# Patient Record
Sex: Female | Born: 1995 | Hispanic: No | Marital: Single | Smoking: Never smoker
Health system: Southern US, Community
[De-identification: ages and names within clinical notes are randomized; demographics above are authoritative.]

## PROBLEM LIST (undated history)

## (undated) DIAGNOSIS — Z789 Other specified health status: Secondary | ICD-10-CM

---
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
## 2016-05-26 ENCOUNTER — Encounter (HOSPITAL_COMMUNITY): Payer: Self-pay | Admitting: Emergency Medicine

## 2016-05-26 ENCOUNTER — Emergency Department (HOSPITAL_COMMUNITY)
Admission: EM | Admit: 2016-05-26 | Discharge: 2016-05-27 | Disposition: A | Discharge status: Home / Self Care | Payer: BLUE CROSS/BLUE SHIELD | Attending: Emergency Medicine | Admitting: Emergency Medicine

## 2016-05-26 ENCOUNTER — Emergency Department (HOSPITAL_COMMUNITY): Payer: BLUE CROSS/BLUE SHIELD

## 2016-05-26 DIAGNOSIS — Y92410 Unspecified street and highway as the place of occurrence of the external cause: Secondary | ICD-10-CM | POA: Insufficient documentation

## 2016-05-26 DIAGNOSIS — S01112A Laceration without foreign body of left eyelid and periocular area, initial encounter: Secondary | ICD-10-CM | POA: Insufficient documentation

## 2016-05-26 DIAGNOSIS — Y999 Unspecified external cause status: Secondary | ICD-10-CM | POA: Insufficient documentation

## 2016-05-26 DIAGNOSIS — S0181XA Laceration without foreign body of other part of head, initial encounter: Secondary | ICD-10-CM | POA: Diagnosis not present

## 2016-05-26 DIAGNOSIS — Y939 Activity, unspecified: Secondary | ICD-10-CM | POA: Diagnosis not present

## 2016-05-26 DIAGNOSIS — R51 Headache: Secondary | ICD-10-CM | POA: Diagnosis not present

## 2016-05-26 DIAGNOSIS — S01312A Laceration without foreign body of left ear, initial encounter: Secondary | ICD-10-CM | POA: Diagnosis not present

## 2016-05-26 LAB — CBC WITH DIFFERENTIAL/PLATELET
Basophils Absolute: 0 10*3/uL (ref 0.0–0.1)
Basophils Relative: 0 %
Eosinophils Absolute: 0.2 10*3/uL (ref 0.0–0.7)
Eosinophils Relative: 1 %
HCT: 44.3 % (ref 36.0–46.0)
Hemoglobin: 14.7 g/dL (ref 12.0–15.0)
Lymphocytes Relative: 18 %
Lymphs Abs: 2.9 10*3/uL (ref 0.7–4.0)
MCH: 30.5 pg (ref 26.0–34.0)
MCHC: 33.2 g/dL (ref 30.0–36.0)
MCV: 91.9 fL (ref 78.0–100.0)
Monocytes Absolute: 0.8 10*3/uL (ref 0.1–1.0)
Monocytes Relative: 5 %
Neutro Abs: 12.1 10*3/uL — ABNORMAL HIGH (ref 1.7–7.7)
Neutrophils Relative %: 76 %
Platelets: 250 10*3/uL (ref 150–400)
RBC: 4.82 MIL/uL (ref 3.87–5.11)
RDW: 12.9 % (ref 11.5–15.5)
WBC: 16 10*3/uL — ABNORMAL HIGH (ref 4.0–10.5)

## 2016-05-26 LAB — I-STAT BETA HCG BLOOD, ED (MC, WL, AP ONLY): I-stat hCG, quantitative: <5 m[IU]/mL (ref <5)

## 2016-05-26 LAB — BASIC METABOLIC PANEL
Anion gap: 12 (ref 5–15)
BUN: 9 mg/dL (ref 6–20)
CO2: 22 mmol/L (ref 22–32)
Calcium: 9.3 mg/dL (ref 8.9–10.3)
Chloride: 105 mmol/L (ref 101–111)
Creatinine, Ser: 0.76 mg/dL (ref 0.44–1.00)
GFR calc Af Amer: >60 mL/min (ref >60)
GFR calc non Af Amer: >60 mL/min (ref >60)
Glucose, Bld: 106 mg/dL — ABNORMAL HIGH (ref 65–99)
Potassium: 3.3 mmol/L — ABNORMAL LOW (ref 3.5–5.1)
Sodium: 139 mmol/L (ref 135–145)

## 2016-05-26 MED ORDER — LIDOCAINE-EPINEPHRINE (PF) 2 %-1:200000 IJ SOLN
20.0000 mL | Freq: Once | INTRAMUSCULAR | Status: AC
  Administered 2016-05-27: 20 mL
  Filled 2016-05-26: qty 20

## 2016-05-26 MED ORDER — LIDOCAINE-EPINEPHRINE (PF) 2 %-1:200000 IJ SOLN: 20.0000 mL | Freq: Once | INTRAMUSCULAR | Status: DC

## 2016-05-26 MED ORDER — TETANUS-DIPHTH-ACELL PERTUSSIS 5-2.5-18.5 LF-MCG/0.5 IM SUSP
0.5000 mL | Freq: Once | INTRAMUSCULAR | Status: AC
  Administered 2016-05-26: 0.5 mL via INTRAMUSCULAR
  Filled 2016-05-26: qty 0.5
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
## 2016-05-26 NOTE — ED Notes (Signed)
Pt raised head off the bed, stated she felt lightheaded. BP dropped and HR increased. Dr. Clydene PughKnott notified. 1L NS administered.
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
## 2016-05-27 DIAGNOSIS — S0181XA Laceration without foreign body of other part of head, initial encounter: Secondary | ICD-10-CM | POA: Diagnosis not present

## 2016-05-27 MED ORDER — MORPHINE SULFATE (PF) 4 MG/ML IV SOLN
4.0000 mg | Freq: Once | INTRAVENOUS | Status: AC
  Administered 2016-05-27: 4 mg via INTRAVENOUS
  Filled 2016-05-27: qty 1

## 2016-05-27 MED ORDER — IBUPROFEN 600 MG PO TABS: 600.0000 mg | ORAL_TABLET | Freq: Four times a day (QID) | ORAL | Status: DC | PRN

## 2016-05-27 MED ORDER — ONDANSETRON HCL 4 MG/2ML IJ SOLN
4.0000 mg | Freq: Once | INTRAMUSCULAR | Status: AC
  Administered 2016-05-27: 4 mg via INTRAVENOUS
  Filled 2016-05-27: qty 2

## 2016-05-27 MED ORDER — HYDROCODONE-ACETAMINOPHEN 5-325 MG PO TABS: 1.0000 | ORAL_TABLET | ORAL | Status: DC | PRN

## 2016-05-27 MED ORDER — LIDOCAINE HCL (PF) 1 % IJ SOLN
INTRAMUSCULAR | Status: AC
  Filled 2016-05-27: qty 30
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
## 2016-11-15 ENCOUNTER — Encounter (HOSPITAL_COMMUNITY): Payer: Self-pay | Admitting: Emergency Medicine

## 2016-11-15 ENCOUNTER — Emergency Department (HOSPITAL_COMMUNITY)
Admission: EM | Admit: 2016-11-15 | Discharge: 2016-11-15 | Disposition: A | Discharge status: Other Acute Inpatient Hospital | Payer: No Typology Code available for payment source | Attending: Emergency Medicine | Admitting: Emergency Medicine

## 2016-11-15 ENCOUNTER — Emergency Department (HOSPITAL_COMMUNITY): Payer: No Typology Code available for payment source

## 2016-11-15 DIAGNOSIS — S51801A Unspecified open wound of right forearm, initial encounter: Secondary | ICD-10-CM | POA: Diagnosis present

## 2016-11-15 DIAGNOSIS — Y929 Unspecified place or not applicable: Secondary | ICD-10-CM | POA: Insufficient documentation

## 2016-11-15 DIAGNOSIS — Y999 Unspecified external cause status: Secondary | ICD-10-CM | POA: Insufficient documentation

## 2016-11-15 DIAGNOSIS — S52101B Unspecified fracture of upper end of right radius, initial encounter for open fracture type I or II: Secondary | ICD-10-CM | POA: Diagnosis not present

## 2016-11-15 DIAGNOSIS — W3400XA Accidental discharge from unspecified firearms or gun, initial encounter: Secondary | ICD-10-CM | POA: Insufficient documentation

## 2016-11-15 DIAGNOSIS — S42401B Unspecified fracture of lower end of right humerus, initial encounter for open fracture: Secondary | ICD-10-CM

## 2016-11-15 DIAGNOSIS — Y939 Activity, unspecified: Secondary | ICD-10-CM | POA: Diagnosis not present

## 2016-11-15 DIAGNOSIS — S52001B Unspecified fracture of upper end of right ulna, initial encounter for open fracture type I or II: Secondary | ICD-10-CM | POA: Diagnosis not present

## 2016-11-15 HISTORY — PX: ARM WOUND REPAIR / CLOSURE: SUR1141

## 2016-11-15 HISTORY — PX: OTHER SURGICAL HISTORY: SHX169

## 2016-11-15 LAB — BASIC METABOLIC PANEL
Anion gap: 9 (ref 5–15)
BUN: 9 mg/dL (ref 6–20)
CO2: 22 mmol/L (ref 22–32)
Calcium: 9.3 mg/dL (ref 8.9–10.3)
Chloride: 106 mmol/L (ref 101–111)
Creatinine, Ser: 0.69 mg/dL (ref 0.44–1.00)
GFR calc Af Amer: >60 mL/min (ref >60)
GFR calc non Af Amer: >60 mL/min (ref >60)
Glucose, Bld: 129 mg/dL — ABNORMAL HIGH (ref 65–99)
Potassium: 3.5 mmol/L (ref 3.5–5.1)
Sodium: 137 mmol/L (ref 135–145)

## 2016-11-15 LAB — HCG, QUANTITATIVE, PREGNANCY: hCG, Beta Chain, Quant, S: 15 m[IU]/mL — ABNORMAL HIGH (ref <5)

## 2016-11-15 LAB — I-STAT BETA HCG BLOOD, ED (MC, WL, AP ONLY): I-stat hCG, quantitative: 13.7 m[IU]/mL — ABNORMAL HIGH (ref <5)

## 2016-11-15 LAB — CBC
HCT: 42.4 % (ref 36.0–46.0)
Hemoglobin: 13.9 g/dL (ref 12.0–15.0)
MCH: 28.7 pg (ref 26.0–34.0)
MCHC: 32.8 g/dL (ref 30.0–36.0)
MCV: 87.4 fL (ref 78.0–100.0)
Platelets: 252 10*3/uL (ref 150–400)
RBC: 4.85 MIL/uL (ref 3.87–5.11)
RDW: 15.3 % (ref 11.5–15.5)
WBC: 14 10*3/uL — ABNORMAL HIGH (ref 4.0–10.5)

## 2016-11-15 LAB — CDS SEROLOGY

## 2016-11-15 MED ORDER — HYDROMORPHONE HCL 2 MG/ML IJ SOLN
INTRAMUSCULAR | Status: AC
  Administered 2016-11-15: 1 mg
  Filled 2016-11-15: qty 1

## 2016-11-15 MED ORDER — CEFAZOLIN IN D5W 1 GM/50ML IV SOLN: 1.0000 g | Freq: Once | INTRAVENOUS | Status: DC

## 2016-11-15 MED ORDER — CEFAZOLIN SODIUM-DEXTROSE 2-4 GM/100ML-% IV SOLN
2.0000 g | Freq: Once | INTRAVENOUS | Status: AC
  Administered 2016-11-15: 2 g via INTRAVENOUS

## 2016-11-15 MED ORDER — ONDANSETRON HCL 4 MG/2ML IJ SOLN
4.0000 mg | Freq: Once | INTRAMUSCULAR | Status: AC
  Administered 2016-11-15: 4 mg via INTRAVENOUS

## 2016-11-15 MED ORDER — CEFAZOLIN SODIUM-DEXTROSE 2-4 GM/100ML-% IV SOLN
INTRAVENOUS | Status: AC
  Filled 2016-11-15: qty 100

## 2016-11-15 MED ORDER — FENTANYL CITRATE (PF) 100 MCG/2ML IJ SOLN
100.0000 ug | Freq: Once | INTRAMUSCULAR | Status: AC
  Administered 2016-11-15: 100 ug via INTRAVENOUS

## 2016-11-15 MED ORDER — HYDROMORPHONE HCL 2 MG/ML IJ SOLN
INTRAMUSCULAR | Status: AC
  Administered 2016-11-15: 2 mg
  Filled 2016-11-15: qty 1
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
## 2016-11-15 NOTE — ED Notes (Signed)
Lab to add on quantitative Hcg

## 2016-11-15 NOTE — ED Notes (Signed)
Pt reports last tetanus injection was given June of 2017.
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
## 2016-11-15 NOTE — ED Notes (Signed)
[facility]. Vitals stable. [facility] with [facility].
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
## 2016-12-15 ENCOUNTER — Inpatient Hospital Stay (HOSPITAL_COMMUNITY)
Admission: AD | Admit: 2016-12-15 | Discharge: 2016-12-16 | Disposition: A | Discharge status: Home / Self Care | Payer: Self-pay | Source: Ambulatory Visit | Attending: Obstetrics and Gynecology | Admitting: Obstetrics and Gynecology

## 2016-12-15 ENCOUNTER — Encounter (HOSPITAL_COMMUNITY): Payer: Self-pay

## 2016-12-15 ENCOUNTER — Inpatient Hospital Stay (HOSPITAL_COMMUNITY): Payer: Self-pay

## 2016-12-15 DIAGNOSIS — Z3A00 Weeks of gestation of pregnancy not specified: Secondary | ICD-10-CM | POA: Insufficient documentation

## 2016-12-15 DIAGNOSIS — Z79899 Other long term (current) drug therapy: Secondary | ICD-10-CM | POA: Insufficient documentation

## 2016-12-15 DIAGNOSIS — B9689 Other specified bacterial agents as the cause of diseases classified elsewhere: Secondary | ICD-10-CM | POA: Insufficient documentation

## 2016-12-15 DIAGNOSIS — O3680X0 Pregnancy with inconclusive fetal viability, not applicable or unspecified: Secondary | ICD-10-CM

## 2016-12-15 DIAGNOSIS — N760 Acute vaginitis: Secondary | ICD-10-CM | POA: Insufficient documentation

## 2016-12-15 DIAGNOSIS — O209 Hemorrhage in early pregnancy, unspecified: Secondary | ICD-10-CM | POA: Insufficient documentation

## 2016-12-15 HISTORY — DX: Other specified health status: Z78.9

## 2016-12-15 LAB — WET PREP, GENITAL
Sperm: NONE SEEN
TRICH WET PREP: NONE SEEN
YEAST WET PREP: NONE SEEN

## 2016-12-15 LAB — POCT PREGNANCY, URINE: PREG TEST UR: POSITIVE — AB

## 2016-12-15 LAB — HCG, QUANTITATIVE, PREGNANCY: hCG, Beta Chain, Quant, S: 2638 m[IU]/mL — ABNORMAL HIGH (ref <5)
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
## 2016-12-15 NOTE — MAU Note (Addendum)
Pt had 3 positive pregnancy tests 2 days ago. Had some spotting last week. Had some today as well.  LMP 11/27/2016 only 4 days. Last intercourse was 12/10/2016. Before that November 14, 2016.  Pt is worried because she had surgery on 11/15/16 and had x-ray on Tuesday.
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
## 2016-12-16 ENCOUNTER — Encounter (HOSPITAL_COMMUNITY): Payer: Self-pay | Admitting: *Deleted

## 2016-12-16 DIAGNOSIS — N760 Acute vaginitis: Secondary | ICD-10-CM

## 2016-12-16 DIAGNOSIS — O26851 Spotting complicating pregnancy, first trimester: Secondary | ICD-10-CM

## 2016-12-16 DIAGNOSIS — O23591 Infection of other part of genital tract in pregnancy, first trimester: Secondary | ICD-10-CM

## 2016-12-16 DIAGNOSIS — B9689 Other specified bacterial agents as the cause of diseases classified elsewhere: Secondary | ICD-10-CM

## 2016-12-16 DIAGNOSIS — Z3A01 Less than 8 weeks gestation of pregnancy: Secondary | ICD-10-CM

## 2016-12-16 LAB — CBC
HEMATOCRIT: 32.9 % — AB (ref 36.0–46.0)
Hemoglobin: 11 g/dL — ABNORMAL LOW (ref 12.0–15.0)
MCH: 28.7 pg (ref 26.0–34.0)
MCHC: 33.4 g/dL (ref 30.0–36.0)
MCV: 85.9 fL (ref 78.0–100.0)
Platelets: 229 10*3/uL (ref 150–400)
RBC: 3.83 MIL/uL — ABNORMAL LOW (ref 3.87–5.11)
RDW: 13.4 % (ref 11.5–15.5)
WBC: 7.7 10*3/uL (ref 4.0–10.5)

## 2016-12-16 LAB — ABO/RH: ABO/RH(D): A POS

## 2016-12-16 MED ORDER — METRONIDAZOLE 500 MG PO TABS: 500.0000 mg | ORAL_TABLET | Freq: Two times a day (BID) | ORAL | 0 refills | Status: DC

## 2016-12-16 NOTE — Progress Notes (Signed)
Jennifer Rasch NP in earlier to discuss test results and d/c plan. Written and verbal d/c instructions given and understanding voiced. 

## 2016-12-17 ENCOUNTER — Inpatient Hospital Stay (HOSPITAL_COMMUNITY)
Admission: AD | Admit: 2016-12-17 | Discharge: 2016-12-17 | Disposition: A | Discharge status: Home / Self Care | Payer: Self-pay | Source: Ambulatory Visit | Attending: Obstetrics and Gynecology | Admitting: Obstetrics and Gynecology

## 2016-12-17 DIAGNOSIS — Z79899 Other long term (current) drug therapy: Secondary | ICD-10-CM | POA: Insufficient documentation

## 2016-12-17 DIAGNOSIS — O209 Hemorrhage in early pregnancy, unspecified: Secondary | ICD-10-CM | POA: Insufficient documentation

## 2016-12-17 DIAGNOSIS — Z3A00 Weeks of gestation of pregnancy not specified: Secondary | ICD-10-CM | POA: Insufficient documentation

## 2016-12-17 LAB — GC/CHLAMYDIA PROBE AMP (~~LOC~~) NOT AT ARMC
Chlamydia: POSITIVE — AB
NEISSERIA GONORRHEA: POSITIVE — AB

## 2016-12-17 LAB — HCG, QUANTITATIVE, PREGNANCY: HCG, BETA CHAIN, QUANT, S: 2586 m[IU]/mL — AB (ref <5)

## 2016-12-17 NOTE — MAU Note (Signed)
PT  SAYS SHE WAS HERE 1-6-   FOR CRAMPING  AND  SPOTTING.     NOW   NO SPOTTING  OR  CRAMPING.
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
## 2016-12-18 ENCOUNTER — Telehealth (HOSPITAL_COMMUNITY): Payer: Self-pay | Admitting: *Deleted

## 2016-12-18 NOTE — Telephone Encounter (Signed)
Notified pt. Of positive gonorrhea and Chlamydia results.  Pt. Directed to go to local health dept., but declined stating she will return to OB/GYN office since she was seen there earlier today. Form sent to health dept. And pt. Advised to refrain from intercourse for 2 weeks following IV treatment.

## 2016-12-20 ENCOUNTER — Inpatient Hospital Stay (HOSPITAL_COMMUNITY)
Admission: AD | Admit: 2016-12-20 | Discharge: 2016-12-20 | Disposition: A | Discharge status: Home / Self Care | Payer: BLUE CROSS/BLUE SHIELD | Source: Ambulatory Visit | Attending: Obstetrics and Gynecology | Admitting: Obstetrics and Gynecology

## 2016-12-20 DIAGNOSIS — O039 Complete or unspecified spontaneous abortion without complication: Secondary | ICD-10-CM | POA: Insufficient documentation

## 2016-12-20 LAB — CBC WITH DIFFERENTIAL/PLATELET
Basophils Absolute: 0 10*3/uL (ref 0.0–0.1)
Basophils Relative: 0 %
EOS PCT: 1 %
Eosinophils Absolute: 0.1 10*3/uL (ref 0.0–0.7)
HEMATOCRIT: 36.8 % (ref 36.0–46.0)
Hemoglobin: 12.3 g/dL (ref 12.0–15.0)
LYMPHS ABS: 2.9 10*3/uL (ref 0.7–4.0)
LYMPHS PCT: 30 %
MCH: 28.5 pg (ref 26.0–34.0)
MCHC: 33.4 g/dL (ref 30.0–36.0)
MCV: 85.4 fL (ref 78.0–100.0)
MONO ABS: 0.7 10*3/uL (ref 0.1–1.0)
Monocytes Relative: 8 %
NEUTROS ABS: 5.9 10*3/uL (ref 1.7–7.7)
Neutrophils Relative %: 61 %
PLATELETS: 252 10*3/uL (ref 150–400)
RBC: 4.31 MIL/uL (ref 3.87–5.11)
RDW: 13.2 % (ref 11.5–15.5)
WBC: 9.7 10*3/uL (ref 4.0–10.5)

## 2016-12-20 LAB — BUN: BUN: 13 mg/dL (ref 6–20)

## 2016-12-20 LAB — CREATININE, SERUM
Creatinine, Ser: 0.58 mg/dL (ref 0.44–1.00)
GFR calc Af Amer: >60 mL/min (ref >60)
GFR calc non Af Amer: >60 mL/min (ref >60)

## 2016-12-20 LAB — TYPE AND SCREEN
ABO/RH(D): A POS
ANTIBODY SCREEN: NEGATIVE

## 2016-12-20 LAB — AST: AST: 17 U/L (ref 15–41)

## 2016-12-20 MED ORDER — METHOTREXATE INJECTION FOR WOMEN'S HOSPITAL
50.0000 mg/m2 | Freq: Once | INTRAMUSCULAR | Status: AC
  Administered 2016-12-20: 80 mg via INTRAMUSCULAR
  Filled 2016-12-20: qty 1.6

## 2016-12-20 MED ORDER — RHO D IMMUNE GLOBULIN 1500 UNIT/2ML IJ SOSY
300.0000 ug | PREFILLED_SYRINGE | Freq: Once | INTRAMUSCULAR | Status: DC
  Filled 2016-12-20: qty 2

## 2016-12-20 NOTE — Discharge Instructions (Signed)
Dr Marcelle OverlieHolland wants you to return to the office on Monday, Jan 13 and Thurs, Jan 16 for repeat hormone level checks.  These follow up visits are very important.  If you have any problems, call the office or if sever pain, return to Maternity Admissions.  Methotrexate Treatment for an Ectopic Pregnancy, Care After Refer to this sheet in the next few weeks. These instructions provide you with information on caring for yourself after your procedure. Your health care provider may also give you more specific instructions. Your treatment has been planned according to current medical practices, but problems sometimes occur. Call your health care provider if you have any problems or questions after your procedure. WHAT TO EXPECT AFTER THE PROCEDURE You may have some abdominal cramping, vaginal bleeding, and fatigue in the first few days after taking methotrexate. Some other possible side effects of methotrexate include:  Nausea.  Vomiting.  Diarrhea.  Mouth sores.  Swelling or irritation of the lining of your lungs (pneumonitis).  Liver damage.  Hair loss. HOME CARE INSTRUCTIONS  After you have received the methotrexate medicine, you need to be careful of your activities and watch your condition for several weeks. It may take 1 week before your hormone levels return to normal.  Keep all follow-up appointments as directed by your health care provider.  Avoid traveling too far away from your health care provider.  Do not have sexual intercourse until your health care provider says it is safe to do so.  You may resume your usual diet.  Limit strenuous activity.  Do not take folic acid, prenatal vitamins, or other vitamins that contain folic acid.  Do not take aspirin, ibuprofen, or naproxen (nonsteroidal anti-inflammatory drugs [NSAIDs]).  Do not drink alcohol. SEEK MEDICAL CARE IF:   You cannot control your nausea and vomiting.  You cannot control your diarrhea.  You have sores in your  mouth and want treatment.  You need pain medicine for your abdominal pain.  You have a rash.  You are having a reaction to the medicine. SEEK IMMEDIATE MEDICAL CARE IF:   You have increasing abdominal or pelvic pain.  You notice increased bleeding.  You feel light-headed, or you faint.  You have shortness of breath.  Your heart rate increases.  You have a cough.  You have chills.  You have a fever. This information is not intended to replace advice given to you by your health care provider. Make sure you discuss any questions you have with your health care provider. Document Released: 11/15/2011 Document Revised: 12/01/2013 Document Reviewed: 09/14/2013 Elsevier Interactive Patient Education  2017 [employer]

## 2016-12-20 NOTE — MAU Note (Signed)
Pt sent from MD office for MTX injection.  Pt having lower abd pain, also bleeding.

## 2016-12-20 NOTE — MAU Note (Signed)
MTX information sheet given to patient, educated pt about process in MAU to receive MTX.  Pt states she is not sure that she will be able to wait, informed patient of seriousness of ruptured ectopic.  Pt agrees that she will stay.

## 2017-11-26 ENCOUNTER — Encounter (HOSPITAL_COMMUNITY): Payer: Self-pay

## 2018-05-19 ENCOUNTER — Other Ambulatory Visit: Payer: Self-pay

## 2018-05-19 ENCOUNTER — Inpatient Hospital Stay (HOSPITAL_COMMUNITY): Payer: Medicaid Other | Admitting: Anesthesiology

## 2018-05-19 ENCOUNTER — Encounter (HOSPITAL_COMMUNITY)
Admission: AD | Disposition: A | Discharge status: Home / Self Care | Payer: Self-pay | Source: Home / Self Care | Attending: Obstetrics

## 2018-05-19 ENCOUNTER — Encounter (HOSPITAL_COMMUNITY): Payer: Self-pay

## 2018-05-19 ENCOUNTER — Inpatient Hospital Stay (HOSPITAL_COMMUNITY): Payer: Medicaid Other

## 2018-05-19 ENCOUNTER — Inpatient Hospital Stay (HOSPITAL_COMMUNITY)
Admission: AD | Admit: 2018-05-19 | Discharge: 2018-05-22 | DRG: 788 | Disposition: A | Discharge status: Home / Self Care | Payer: Medicaid Other | Attending: Obstetrics | Admitting: Obstetrics

## 2018-05-19 DIAGNOSIS — O99824 Streptococcus B carrier state complicating childbirth: Secondary | ICD-10-CM | POA: Diagnosis present

## 2018-05-19 DIAGNOSIS — Z3483 Encounter for supervision of other normal pregnancy, third trimester: Secondary | ICD-10-CM | POA: Diagnosis present

## 2018-05-19 DIAGNOSIS — Z3A38 38 weeks gestation of pregnancy: Secondary | ICD-10-CM

## 2018-05-19 DIAGNOSIS — O324XX0 Maternal care for high head at term, not applicable or unspecified: Secondary | ICD-10-CM | POA: Diagnosis present

## 2018-05-19 LAB — COMPREHENSIVE METABOLIC PANEL
ALBUMIN: 2.7 g/dL — AB (ref 3.5–5.0)
ALT: 13 U/L — AB (ref 14–54)
AST: 24 U/L (ref 15–41)
Alkaline Phosphatase: 201 U/L — ABNORMAL HIGH (ref 38–126)
Anion gap: 9 (ref 5–15)
BILIRUBIN TOTAL: 0.5 mg/dL (ref 0.3–1.2)
BUN: 9 mg/dL (ref 6–20)
CHLORIDE: 107 mmol/L (ref 101–111)
CO2: 19 mmol/L — ABNORMAL LOW (ref 22–32)
CREATININE: 0.62 mg/dL (ref 0.44–1.00)
Calcium: 8.7 mg/dL — ABNORMAL LOW (ref 8.9–10.3)
GFR calc Af Amer: >60 mL/min (ref >60)
GFR calc non Af Amer: >60 mL/min (ref >60)
GLUCOSE: 79 mg/dL (ref 65–99)
POTASSIUM: 4 mmol/L (ref 3.5–5.1)
Sodium: 135 mmol/L (ref 135–145)
Total Protein: 6 g/dL — ABNORMAL LOW (ref 6.5–8.1)

## 2018-05-19 LAB — URINALYSIS, ROUTINE W REFLEX MICROSCOPIC
BILIRUBIN URINE: NEGATIVE
Glucose, UA: NEGATIVE mg/dL
Hgb urine dipstick: NEGATIVE
Ketones, ur: NEGATIVE mg/dL
LEUKOCYTES UA: NEGATIVE
NITRITE: NEGATIVE
PH: 6 (ref 5.0–8.0)
Protein, ur: NEGATIVE mg/dL
SPECIFIC GRAVITY, URINE: 1.005 (ref 1.005–1.030)

## 2018-05-19 LAB — PROTEIN / CREATININE RATIO, URINE
Creatinine, Urine: 31 mg/dL
Protein Creatinine Ratio: 0.32 mg/mg{Cre} — ABNORMAL HIGH (ref 0.00–0.15)
Total Protein, Urine: 10 mg/dL

## 2018-05-19 LAB — CBC
HEMATOCRIT: 35.7 % — AB (ref 36.0–46.0)
Hemoglobin: 12.1 g/dL (ref 12.0–15.0)
MCH: 29.3 pg (ref 26.0–34.0)
MCHC: 33.9 g/dL (ref 30.0–36.0)
MCV: 86.4 fL (ref 78.0–100.0)
PLATELETS: 146 10*3/uL — AB (ref 150–400)
RBC: 4.13 MIL/uL (ref 3.87–5.11)
RDW: 14.2 % (ref 11.5–15.5)
WBC: 8.6 10*3/uL (ref 4.0–10.5)

## 2018-05-19 LAB — POCT FERN TEST: POCT FERN TEST: POSITIVE

## 2018-05-19 LAB — RPR: RPR Ser Ql: NONREACTIVE

## 2018-05-19 LAB — TYPE AND SCREEN
ABO/RH(D): A POS
ANTIBODY SCREEN: NEGATIVE

## 2018-05-19 SURGERY — Surgical Case
Anesthesia: Epidural

## 2018-05-19 MED ORDER — DIPHENHYDRAMINE HCL 50 MG/ML IJ SOLN: 12.5000 mg | INTRAMUSCULAR | Status: DC | PRN

## 2018-05-19 MED ORDER — NALBUPHINE HCL 10 MG/ML IJ SOLN: 5.0000 mg | INTRAMUSCULAR | Status: DC | PRN

## 2018-05-19 MED ORDER — CEFAZOLIN SODIUM-DEXTROSE 2-3 GM-%(50ML) IV SOLR
INTRAVENOUS | Status: DC | PRN
  Administered 2018-05-19: 2 g via INTRAVENOUS

## 2018-05-19 MED ORDER — OXYCODONE-ACETAMINOPHEN 5-325 MG PO TABS: 2.0000 | ORAL_TABLET | ORAL | Status: DC | PRN

## 2018-05-19 MED ORDER — LACTATED RINGERS IV SOLN
500.0000 mL | Freq: Once | INTRAVENOUS | Status: AC
  Administered 2018-05-19: 500 mL via INTRAVENOUS

## 2018-05-19 MED ORDER — MEPERIDINE HCL 25 MG/ML IJ SOLN
INTRAMUSCULAR | Status: AC
  Filled 2018-05-19: qty 1

## 2018-05-19 MED ORDER — MORPHINE SULFATE (PF) 0.5 MG/ML IJ SOLN
INTRAMUSCULAR | Status: DC | PRN
  Administered 2018-05-19: 4 mg via EPIDURAL
  Administered 2018-05-19: 1 mg via INTRAVENOUS

## 2018-05-19 MED ORDER — EPHEDRINE 5 MG/ML INJ: 10.0000 mg | INTRAVENOUS | Status: DC | PRN

## 2018-05-19 MED ORDER — SODIUM CHLORIDE 0.9 % IR SOLN
Status: DC | PRN
  Administered 2018-05-19: 1

## 2018-05-19 MED ORDER — DIPHENHYDRAMINE HCL 25 MG PO CAPS
25.0000 mg | ORAL_CAPSULE | ORAL | Status: DC | PRN
  Administered 2018-05-20: 25 mg via ORAL
  Filled 2018-05-19: qty 1

## 2018-05-19 MED ORDER — SOD CITRATE-CITRIC ACID 500-334 MG/5ML PO SOLN
30.0000 mL | ORAL | Status: DC | PRN
  Administered 2018-05-19: 30 mL via ORAL
  Filled 2018-05-19: qty 15

## 2018-05-19 MED ORDER — MEPERIDINE HCL 25 MG/ML IJ SOLN: 6.2500 mg | INTRAMUSCULAR | Status: DC | PRN

## 2018-05-19 MED ORDER — FLEET ENEMA 7-19 GM/118ML RE ENEM: 1.0000 | ENEMA | RECTAL | Status: DC | PRN

## 2018-05-19 MED ORDER — KETOROLAC TROMETHAMINE 30 MG/ML IJ SOLN
30.0000 mg | Freq: Four times a day (QID) | INTRAMUSCULAR | Status: AC | PRN
  Administered 2018-05-19: 30 mg via INTRAVENOUS

## 2018-05-19 MED ORDER — SODIUM BICARBONATE 8.4 % IV SOLN
INTRAVENOUS | Status: DC | PRN
  Administered 2018-05-19 (×2): 5 mL via EPIDURAL

## 2018-05-19 MED ORDER — SCOPOLAMINE 1 MG/3DAYS TD PT72: 1.0000 | MEDICATED_PATCH | Freq: Once | TRANSDERMAL | Status: DC

## 2018-05-19 MED ORDER — OXYTOCIN 10 UNIT/ML IJ SOLN
INTRAMUSCULAR | Status: DC | PRN
  Administered 2018-05-19: 40 [IU] via INTRAVENOUS

## 2018-05-19 MED ORDER — FENTANYL 2.5 MCG/ML BUPIVACAINE 1/10 % EPIDURAL INFUSION (WH - ANES)
14.0000 mL/h | INTRAMUSCULAR | Status: DC | PRN
  Administered 2018-05-19 (×2): 14 mL/h via EPIDURAL
  Filled 2018-05-19 (×2): qty 100

## 2018-05-19 MED ORDER — OXYTOCIN 10 UNIT/ML IJ SOLN
INTRAMUSCULAR | Status: AC
  Filled 2018-05-19: qty 4

## 2018-05-19 MED ORDER — OXYTOCIN BOLUS FROM INFUSION: 500.0000 mL | Freq: Once | INTRAVENOUS | Status: DC

## 2018-05-19 MED ORDER — ONDANSETRON HCL 4 MG/2ML IJ SOLN: 4.0000 mg | Freq: Three times a day (TID) | INTRAMUSCULAR | Status: DC | PRN

## 2018-05-19 MED ORDER — TERBUTALINE SULFATE 1 MG/ML IJ SOLN
INTRAMUSCULAR | Status: AC
  Administered 2018-05-19: 1 mg via SUBCUTANEOUS
  Filled 2018-05-19: qty 1

## 2018-05-19 MED ORDER — PENICILLIN G POT IN DEXTROSE 60000 UNIT/ML IV SOLN
3.0000 10*6.[IU] | INTRAVENOUS | Status: DC
  Administered 2018-05-19 (×2): 3 10*6.[IU] via INTRAVENOUS
  Filled 2018-05-19 (×4): qty 50

## 2018-05-19 MED ORDER — LIDOCAINE HCL (PF) 1 % IJ SOLN
INTRAMUSCULAR | Status: DC | PRN
  Administered 2018-05-19: 5 mL via EPIDURAL

## 2018-05-19 MED ORDER — CEFAZOLIN SODIUM-DEXTROSE 2-4 GM/100ML-% IV SOLN: 2.0000 g | Freq: Once | INTRAVENOUS | Status: DC

## 2018-05-19 MED ORDER — SODIUM CHLORIDE 0.9% FLUSH: 3.0000 mL | INTRAVENOUS | Status: DC | PRN

## 2018-05-19 MED ORDER — KETOROLAC TROMETHAMINE 30 MG/ML IJ SOLN
INTRAMUSCULAR | Status: AC
  Administered 2018-05-19: 30 mg via INTRAVENOUS
  Filled 2018-05-19: qty 1

## 2018-05-19 MED ORDER — PHENYLEPHRINE 40 MCG/ML (10ML) SYRINGE FOR IV PUSH (FOR BLOOD PRESSURE SUPPORT)
80.0000 ug | PREFILLED_SYRINGE | INTRAVENOUS | Status: DC | PRN
  Filled 2018-05-19: qty 10

## 2018-05-19 MED ORDER — LIDOCAINE HCL (PF) 1 % IJ SOLN
30.0000 mL | INTRAMUSCULAR | Status: DC | PRN
  Filled 2018-05-19: qty 30

## 2018-05-19 MED ORDER — NALOXONE HCL 0.4 MG/ML IJ SOLN: 0.4000 mg | INTRAMUSCULAR | Status: DC | PRN

## 2018-05-19 MED ORDER — NALOXONE HCL 4 MG/10ML IJ SOLN
1.0000 ug/kg/h | INTRAVENOUS | Status: DC | PRN
  Filled 2018-05-19: qty 5

## 2018-05-19 MED ORDER — LACTATED RINGERS IV SOLN: 500.0000 mL | INTRAVENOUS | Status: DC | PRN

## 2018-05-19 MED ORDER — DEXAMETHASONE SODIUM PHOSPHATE 4 MG/ML IJ SOLN
INTRAMUSCULAR | Status: DC | PRN
  Administered 2018-05-19: 4 mg via INTRAVENOUS

## 2018-05-19 MED ORDER — SODIUM CHLORIDE 0.9 % IV SOLN
5.0000 10*6.[IU] | Freq: Once | INTRAVENOUS | Status: AC
  Administered 2018-05-19: 5 10*6.[IU] via INTRAVENOUS
  Filled 2018-05-19: qty 5

## 2018-05-19 MED ORDER — LACTATED RINGERS IV SOLN
INTRAVENOUS | Status: DC | PRN
  Administered 2018-05-19 (×2): via INTRAVENOUS

## 2018-05-19 MED ORDER — ACETAMINOPHEN 325 MG PO TABS: 650.0000 mg | ORAL_TABLET | ORAL | Status: DC | PRN

## 2018-05-19 MED ORDER — SCOPOLAMINE 1 MG/3DAYS TD PT72
MEDICATED_PATCH | TRANSDERMAL | Status: DC | PRN
  Administered 2018-05-19: 1 via TRANSDERMAL

## 2018-05-19 MED ORDER — KETOROLAC TROMETHAMINE 30 MG/ML IJ SOLN: 30.0000 mg | Freq: Four times a day (QID) | INTRAMUSCULAR | Status: AC | PRN

## 2018-05-19 MED ORDER — MEPERIDINE HCL 25 MG/ML IJ SOLN
INTRAMUSCULAR | Status: DC | PRN
  Administered 2018-05-19: 25 mg via INTRAVENOUS

## 2018-05-19 MED ORDER — PHENYLEPHRINE 40 MCG/ML (10ML) SYRINGE FOR IV PUSH (FOR BLOOD PRESSURE SUPPORT): 80.0000 ug | PREFILLED_SYRINGE | INTRAVENOUS | Status: DC | PRN

## 2018-05-19 MED ORDER — OXYTOCIN 40 UNITS IN LACTATED RINGERS INFUSION - SIMPLE MED
2.5000 [IU]/h | INTRAVENOUS | Status: DC
  Filled 2018-05-19: qty 1000

## 2018-05-19 MED ORDER — OXYCODONE-ACETAMINOPHEN 5-325 MG PO TABS: 1.0000 | ORAL_TABLET | ORAL | Status: DC | PRN

## 2018-05-19 MED ORDER — ONDANSETRON HCL 4 MG/2ML IJ SOLN
4.0000 mg | Freq: Four times a day (QID) | INTRAMUSCULAR | Status: DC | PRN
  Administered 2018-05-19 (×2): 4 mg via INTRAVENOUS
  Filled 2018-05-19: qty 2

## 2018-05-19 MED ORDER — ONDANSETRON HCL 4 MG/2ML IJ SOLN
INTRAMUSCULAR | Status: AC
  Filled 2018-05-19: qty 2

## 2018-05-19 MED ORDER — MORPHINE SULFATE (PF) 0.5 MG/ML IJ SOLN
INTRAMUSCULAR | Status: AC
Start: 2018-05-19 — End: ?
  Filled 2018-05-19: qty 10

## 2018-05-19 MED ORDER — NALBUPHINE HCL 10 MG/ML IJ SOLN: 5.0000 mg | Freq: Once | INTRAMUSCULAR | Status: DC | PRN

## 2018-05-19 MED ORDER — LACTATED RINGERS IV SOLN: 500.0000 mL | Freq: Once | INTRAVENOUS | Status: DC

## 2018-05-19 MED ORDER — LACTATED RINGERS IV SOLN
INTRAVENOUS | Status: DC
  Administered 2018-05-19 (×2): via INTRAVENOUS

## 2018-05-19 SURGICAL SUPPLY — 38 items
BENZOIN TINCTURE PRP APPL 2/3 (GAUZE/BANDAGES/DRESSINGS) ×3 IMPLANT
CHLORAPREP W/TINT 26ML (MISCELLANEOUS) ×3 IMPLANT
CLAMP CORD UMBIL (MISCELLANEOUS) IMPLANT
CLOSURE WOUND 1/2 X4 (GAUZE/BANDAGES/DRESSINGS) ×1
CLOTH BEACON ORANGE TIMEOUT ST (SAFETY) ×3 IMPLANT
DRSG OPSITE POSTOP 4X10 (GAUZE/BANDAGES/DRESSINGS) ×3 IMPLANT
ELECT REM PT RETURN 9FT ADLT (ELECTROSURGICAL) ×3
ELECTRODE REM PT RTRN 9FT ADLT (ELECTROSURGICAL) ×1 IMPLANT
EXTRACTOR VACUUM KIWI (MISCELLANEOUS) IMPLANT
GAUZE SPONGE 4X4 12PLY STRL LF (GAUZE/BANDAGES/DRESSINGS) ×6 IMPLANT
GLOVE BIOGEL PI IND STRL 6.5 (GLOVE) ×1 IMPLANT
GLOVE BIOGEL PI IND STRL 7.0 (GLOVE) ×1 IMPLANT
GLOVE BIOGEL PI INDICATOR 6.5 (GLOVE) ×2
GLOVE BIOGEL PI INDICATOR 7.0 (GLOVE) ×2
GLOVE ECLIPSE 6.0 STRL STRAW (GLOVE) ×3 IMPLANT
GOWN STRL REUS W/TWL LRG LVL3 (GOWN DISPOSABLE) ×6 IMPLANT
KIT ABG SYR 3ML LUER SLIP (SYRINGE) IMPLANT
NEEDLE HYPO 25X5/8 SAFETYGLIDE (NEEDLE) IMPLANT
NS IRRIG 1000ML POUR BTL (IV SOLUTION) ×3 IMPLANT
PACK C SECTION WH (CUSTOM PROCEDURE TRAY) ×3 IMPLANT
PAD ABD 7.5X8 STRL (GAUZE/BANDAGES/DRESSINGS) ×3 IMPLANT
PAD OB MATERNITY 4.3X12.25 (PERSONAL CARE ITEMS) ×3 IMPLANT
PENCIL SMOKE EVAC W/HOLSTER (ELECTROSURGICAL) ×3 IMPLANT
RTRCTR C-SECT PINK 25CM LRG (MISCELLANEOUS) ×3 IMPLANT
STRIP CLOSURE SKIN 1/2X4 (GAUZE/BANDAGES/DRESSINGS) ×2 IMPLANT
SUT MNCRL 0 VIOLET CTX 36 (SUTURE) ×2 IMPLANT
SUT MNCRL AB 3-0 PS2 27 (SUTURE) ×6 IMPLANT
SUT MONOCRYL 0 CTX 36 (SUTURE) ×4
SUT PLAIN 0 NONE (SUTURE) IMPLANT
SUT PLAIN 2 0 (SUTURE) ×2
SUT PLAIN ABS 2-0 CT1 27XMFL (SUTURE) ×1 IMPLANT
SUT VIC AB 0 CTX 36 (SUTURE) ×4
SUT VIC AB 0 CTX36XBRD ANBCTRL (SUTURE) ×2 IMPLANT
SUT VIC AB 2-0 CT1 27 (SUTURE) ×2
SUT VIC AB 2-0 CT1 TAPERPNT 27 (SUTURE) ×1 IMPLANT
TOWEL OR 17X24 6PK STRL BLUE (TOWEL DISPOSABLE) ×3 IMPLANT
TRAY FOLEY W/BAG SLVR 14FR LF (SET/KITS/TRAYS/PACK) ×3 IMPLANT
WATER STERILE IRR 1000ML POUR (IV SOLUTION) ×3 IMPLANT
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
## 2018-05-19 NOTE — Progress Notes (Signed)
In O.R. 

## 2018-05-19 NOTE — Progress Notes (Signed)
Epidural doses for surgery

## 2018-05-19 NOTE — MAU Note (Signed)
Pt. Reports a gush of fluid at 3am. Reports continuous leaking and has felt fetal movement since being in the lobby. Denies vaginal bleeding. Rates ctx at 5/10 and happening every 7 min.

## 2018-05-19 NOTE — Progress Notes (Signed)
Dr Chestine Sporelark discussing with pt progress of second stage, possible options at this stage

## 2018-05-19 NOTE — Lactation Note (Signed)
This note was copied from a baby's chart. Lactation Consultation Note  Patient Name: Kim Marta LamasBriana [name] ZOXWR'UToday's Date: 05/19/2018 Reason for consult: Initial assessment;1st time breastfeeding;Primapara;Early term 37-38.6wks  P1 mother whose infant is now 354 hours old.  Mother requesting latch assistance even though baby is not showing any feeding cues.  Discussed football hold with mother and she was willing to try that position.  Reviewed hand expression and mother was able to obtain a few colostrum drops.  Colostrum container provided.  Mother will feed back any EBM she obtains.  Assisted baby to latch onto the right breast in the football hold.  Baby opened wide and flanged lips were noted.  She was tired but did suck with stimulation.  Showed mother techniques to keep her awake at the breast.  Mother's breasts are soft and non tender and nipples are erect.  She felt no pain with the latch.  Encouraged mother to feed 8-12 times/ 24 hours or more often if baby shows feeding cues.  Reviewed feeding cues.  Continue STS, breast massage and hand expression.  Mother knows to put baby in bassinet when she feels too tired to hold safely.  Mom made aware of O/P services, breastfeeding support groups, community resources, and our phone # for post-discharge questions. Mother will call for assistance as needed.  Visitors present and supportive.  RN updated.   Maternal Data Formula Feeding for Exclusion: No Has patient been taught Hand Expression?: Yes Does the patient have breastfeeding experience prior to this delivery?: No  Feeding Feeding Type: Breast Fed Length of feed: 12 min  LATCH Score Latch: Grasps breast easily, tongue down, lips flanged, rhythmical sucking.  Audible Swallowing: None  Type of Nipple: Everted at rest and after stimulation  Comfort (Breast/Nipple): Soft / non-tender  Hold (Positioning): Assistance needed to correctly position infant at breast and maintain  latch.  LATCH Score: 7  Interventions Interventions: Breast feeding basics reviewed;Assisted with latch;Skin to skin;Breast massage;Hand express;Position options;Support pillows;Adjust position;Breast compression  Lactation Tools Discussed/Used WIC Program: Yes   Consult Status Consult Status: Follow-up Date: 05/20/18 Follow-up type: In-patient    Argentina Kosch R Malcolm Quast 05/19/2018, 11:59 PM

## 2018-05-19 NOTE — Brief Op Note (Addendum)
05/19/2018  9:31 PM  PATIENT:  Despina HiddenBriana N Zawislak  22 y.o. female  PRE-OPERATIVE DIAGNOSIS:  Primary cesarean; arrest of descent and non-reassuring fetal status  POST-OPERATIVE DIAGNOSIS:  Primary cesarean; arrest of descent and non-reassuring fetal  PROCEDURE:  Procedure(s): CESAREAN SECTION (N/A)  SURGEON:  Surgeon(s) and Role:    Marlow Baars* Sriya Kroeze, MD - Primary  ANESTHESIA:   epidural  EBL:  735 mL   BLOOD ADMINISTERED:none  DRAINS: none   LOCAL MEDICATIONS USED:  NONE  SPECIMEN:  No Specimen  DISPOSITION OF SPECIMEN:  N/A  COUNTS:  YES  TOURNIQUET:  * No tourniquets in log *  DICTATION: .Note written in EPIC  PLAN OF CARE: Admit to inpatient   PATIENT DISPOSITION:  PACU - hemodynamically stable.   Delay start of Pharmacological VTE agent (>24hrs) due to surgical blood loss or risk of bleeding: not applicable

## 2018-05-19 NOTE — Anesthesia Pain Management Evaluation Note (Signed)
  CRNA Pain Management Visit Note  Patient: Kim SicklesBriana N [name], 22 y.o., female  "Hello I am a member of the anesthesia team at [facility]. We have an anesthesia team available at all times to provide care throughout the hospital, including epidural management and anesthesia for C-section. I don't know your plan for the delivery whether it a natural birth, water birth, IV sedation, nitrous supplementation, doula or epidural, but we want to meet your pain goals."   1.Was your pain managed to your expectations on prior hospitalizations?   Yes   2.What is your expectation for pain management during this hospitalization?     Epidural  3.How can we help you reach that goal: epidural  Record the patient's initial score and the patient's pain goal.   Pain: 8/10  Pain Goal: 1/10 [facility] wants you to be able to say your pain was always managed very well.  Salome ArntSterling, Otha Monical Marie 05/19/2018

## 2018-05-19 NOTE — Anesthesia Postprocedure Evaluation (Signed)
Anesthesia Post Note  Patient: Kim [name]  Procedure(s) Performed: CESAREAN SECTION (N/A )     Patient location during evaluation: Mother Baby Anesthesia Type: Epidural Level of consciousness: awake and alert Pain management: pain level controlled Vital Signs Assessment: post-procedure vital signs reviewed and stable Respiratory status: spontaneous breathing, nonlabored ventilation and respiratory function stable Cardiovascular status: stable Postop Assessment: no headache, no backache and epidural receding Anesthetic complications: no    Last Vitals:  Vitals:   05/19/18 2145 05/19/18 2207  BP: 120/71 118/78  Pulse: 77 63  Resp: 15 16  Temp:    SpO2: 99% 100%    Last Pain:  Vitals:   05/19/18 2145  TempSrc:   PainSc: 1    Pain Goal:                 Javone Ybanez

## 2018-05-19 NOTE — Progress Notes (Signed)
Not pushing for 12 minutes

## 2018-05-19 NOTE — Op Note (Signed)
Cesarean Section Procedure Note PRE-OPERATIVE DIAGNOSIS:  Primary cesarean; arrest of descent and non-reassuring fetal status  POST-OPERATIVE DIAGNOSIS:  Primary cesarean; arrest of descent and non-reassuring fetal  PROCEDURE:  Procedure(s): CESAREAN SECTION (N/A)  SURGEON:  Surgeon(s) and Role:    Marlow Baars* Ianna Salmela, MD - Primary  ANESTHESIA:   epidural  EBL:  735 mL   BLOOD ADMINISTERED:none  FINDINGS:  Normal uterus, tubes and ovaries bilaterally.  Viable female infant, 3495g (7lb 11oz) Apgars 10, 10.    Procedure Details   After epidural anesthesia was found to adequate , the patient was placed in the dorsal supine position with a leftward tilt, prepped and draped in the usual sterile manner. A Pfannenstiel incision was made and carried down through the subcutaneous tissue to the fascia.  The fascia was incised in the midline and the fascial incision was extended laterally with Mayo scissors. The superior aspect of the fascial incision was grasped with two Kocher clamp, tented up and the rectus muscles dissected off sharply. The rectus was then dissected off with blunt dissection and Mayo scissors inferiorly. The rectus muscles were separated in the midline. The abdominal peritoneum was identified, tented up, entered bluntly, and the incision was extended superiorly and inferiorly with good visualization of the bladder. The Alexis retractor was deployed. The vesicouterine peritoneum was identified, tented up, entered sharply, and the bladder flap was created digitally. A scalpel was then used to make a low transverse incision on the uterus which was extended in the cephalad-caudad direction with blunt dissection. The fluid was clear. The fetal vertex was identified, elevated out of the pelvis and brought to the hysterotomy.  There was a nuchal cord x 1.  The head was elevated out of the pelvis and delivered easily followed by the shoulders and body.  After a 60 second delay per protocol,  the cord was clamped and cut and the infant was passed to the waiting neonatologist.  The placenta was then delivered spontaneously, intact and appear normal, the uterus was cleared of all clot and debris   The hysterotomy was repaired with #0 Monocryl in running locked fashion.  A second imbricating layer of #0 Monocryl was placed.  An additional figure of eight suture was placed at the midportion of the hysterotomy as well as the lower right edge, and excellent hemostasis was noted.  The Alexis retractor was removed from the abdomen. The peritoneum was examined and all vessels noted to be hemostatic. The abdominal cavity was cleared of all clot and debris.  The peritoneum was closed with 2-0 vicryl in a running fashion.  The rectus muscles were then closed with 2-0 Vicryl. The fascia and rectus muscles were inspected and were hemostatic. The fascia was closed with 0 Vicryl in a running fashion. The subcuticular layer was irrigated and all bleeders cauterized. The skin was closed with 3-0 monocryl in a subcuticular fashion. The incision was dressed with benzoine, steri strips and honeycomb dressing. All sponge lap and needle counts were correct x3. Patient tolerated the procedure well and recovered in stable condition following the procedure.

## 2018-05-19 NOTE — Anesthesia Preprocedure Evaluation (Signed)
Anesthesia Evaluation  Patient identified by MRN, date of birth, ID band Patient awake    Reviewed: Allergy & Precautions, NPO status , Patient's Chart, lab work & pertinent test results  Airway Mallampati: II  TM Distance: >3 FB Neck ROM: Full    Dental no notable dental hx. (+) Teeth Intact, Dental Advisory Given   Pulmonary neg pulmonary ROS,    Pulmonary exam normal breath sounds clear to auscultation       Cardiovascular negative cardio ROS Normal cardiovascular exam Rhythm:Regular Rate:Normal     Neuro/Psych negative neurological ROS     GI/Hepatic   Endo/Other    Renal/GU      Musculoskeletal   Abdominal   Peds  Hematology   Anesthesia Other Findings   Reproductive/Obstetrics (+) Pregnancy                             Lab Results  Component Value Date   WBC 8.6 05/19/2018   HGB 12.1 05/19/2018   HCT 35.7 (L) 05/19/2018   MCV 86.4 05/19/2018   PLT 146 (L) 05/19/2018    Anesthesia Physical Anesthesia Plan  ASA: II  Anesthesia Plan: Epidural   Post-op Pain Management:    Induction:   PONV Risk Score and Plan:   Airway Management Planned:   Additional Equipment:   Intra-op Plan:   Post-operative Plan:   Informed Consent: I have reviewed the patients History and Physical, chart, labs and discussed the procedure including the risks, benefits and alternatives for the proposed anesthesia with the patient or authorized representative who has indicated his/her understanding and acceptance.     Plan Discussed with:   Anesthesia Plan Comments:         Anesthesia Quick Evaluation

## 2018-05-19 NOTE — Transfer of Care (Signed)
Immediate Anesthesia Transfer of Care Note  Patient: Kim [name]  Procedure(s) Performed: CESAREAN SECTION (N/A )  Patient Location: PACU  Anesthesia Type:Epidural  Level of Consciousness: awake, alert  and oriented  Airway & Oxygen Therapy: Patient Spontanous Breathing  Post-op Assessment: Report given to RN and Post -op Vital signs reviewed and stable  Post vital signs: Reviewed and stable  Last Vitals:  Vitals Value Taken Time  BP    Temp    Pulse    Resp    SpO2      Last Pain:  Vitals:   05/19/18 1601  TempSrc: Oral  PainSc:          Complications: No apparent anesthesia complications

## 2018-05-19 NOTE — Anesthesia Procedure Notes (Signed)
Epidural Patient location during procedure: OB Start time: 05/19/2018 7:50 AM End time: 05/19/2018 8:05 AM  Staffing Anesthesiologist: Trevor IhaHouser, Tigerlily Christine A, MD Performed: anesthesiologist   Preanesthetic Checklist Completed: patient identified, site marked, surgical consent, pre-op evaluation, timeout performed, IV checked, risks and benefits discussed and monitors and equipment checked  Epidural Patient position: sitting Prep: site prepped and draped and DuraPrep Patient monitoring: continuous pulse ox and blood pressure Approach: midline Location: L3-L4 Injection technique: LOR air  Needle:  Needle type: Tuohy  Needle gauge: 17 G Needle length: 9 cm and 9 Needle insertion depth: 6 cm Catheter type: closed end flexible Catheter size: 19 Gauge Catheter at skin depth: 12 cm Test dose: negative  Assessment Events: blood not aspirated, injection not painful, no injection resistance, negative IV test and no paresthesia

## 2018-05-19 NOTE — Progress Notes (Signed)
Patient has pushed for approximately 2.5 hours.  Since that time, moderate caput has developed but fetal station has remained at 0 with a significant portion of the fetal head above the pubic bone.  Variability remains moderate between contractions, but variable decels now have a nadir in the 60s with slower return to baseline.  Patient is becoming fatigued and anxious.  At this time, I recommend that we proceed to the OR for persistent Cat 2 tracing and arrest of descent.  Discussed risks to include, but not limited to, infection, bleeding, damage to structures (bowel, bladder, tubes, ovaries, nerves, vessels, baby), vte/pe, need for blood transfusion, need for additional procedures.

## 2018-05-19 NOTE — Progress Notes (Signed)
Transport to OR

## 2018-05-19 NOTE — MAU Note (Signed)
ROM at 0300.  Having CTX every 5-7 minutes.  Also noticed a scant amount of bleeding.  Wasn't feeling baby move since ROM.  But felt her move just now.  GBS +. Was 2 cm last Thursday.

## 2018-05-20 ENCOUNTER — Encounter (HOSPITAL_COMMUNITY): Payer: Self-pay | Admitting: Obstetrics

## 2018-05-20 LAB — CBC
HCT: 29 % — ABNORMAL LOW (ref 36.0–46.0)
Hemoglobin: 9.9 g/dL — ABNORMAL LOW (ref 12.0–15.0)
MCH: 29.7 pg (ref 26.0–34.0)
MCHC: 34.1 g/dL (ref 30.0–36.0)
MCV: 87.1 fL (ref 78.0–100.0)
PLATELETS: 130 10*3/uL — AB (ref 150–400)
RBC: 3.33 MIL/uL — ABNORMAL LOW (ref 3.87–5.11)
RDW: 14.4 % (ref 11.5–15.5)
WBC: 15.9 10*3/uL — AB (ref 4.0–10.5)

## 2018-05-20 MED ORDER — TETANUS-DIPHTH-ACELL PERTUSSIS 5-2.5-18.5 LF-MCG/0.5 IM SUSP: 0.5000 mL | Freq: Once | INTRAMUSCULAR | Status: DC

## 2018-05-20 MED ORDER — IBUPROFEN 600 MG PO TABS
600.0000 mg | ORAL_TABLET | Freq: Four times a day (QID) | ORAL | Status: DC
  Administered 2018-05-20 – 2018-05-22 (×9): 600 mg via ORAL
  Filled 2018-05-20 (×9): qty 1

## 2018-05-20 MED ORDER — SENNOSIDES-DOCUSATE SODIUM 8.6-50 MG PO TABS
2.0000 | ORAL_TABLET | ORAL | Status: DC
  Administered 2018-05-20 – 2018-05-21 (×3): 2 via ORAL
  Filled 2018-05-20 (×3): qty 2

## 2018-05-20 MED ORDER — OXYCODONE HCL 5 MG PO TABS
5.0000 mg | ORAL_TABLET | ORAL | Status: DC | PRN
  Administered 2018-05-20: 5 mg via ORAL
  Filled 2018-05-20: qty 1

## 2018-05-20 MED ORDER — DIPHENHYDRAMINE HCL 25 MG PO CAPS: 25.0000 mg | ORAL_CAPSULE | Freq: Four times a day (QID) | ORAL | Status: DC | PRN

## 2018-05-20 MED ORDER — DIBUCAINE 1 % RE OINT: 1.0000 "application " | TOPICAL_OINTMENT | RECTAL | Status: DC | PRN

## 2018-05-20 MED ORDER — LACTATED RINGERS IV SOLN
INTRAVENOUS | Status: DC
  Administered 2018-05-20: 01:00:00 via INTRAVENOUS

## 2018-05-20 MED ORDER — OXYTOCIN 40 UNITS IN LACTATED RINGERS INFUSION - SIMPLE MED: 2.5000 [IU]/h | INTRAVENOUS | Status: AC

## 2018-05-20 MED ORDER — WITCH HAZEL-GLYCERIN EX PADS: 1.0000 "application " | MEDICATED_PAD | CUTANEOUS | Status: DC | PRN

## 2018-05-20 MED ORDER — FERROUS SULFATE 325 (65 FE) MG PO TABS
325.0000 mg | ORAL_TABLET | Freq: Every day | ORAL | Status: DC
  Administered 2018-05-20 – 2018-05-22 (×3): 325 mg via ORAL
  Filled 2018-05-20 (×3): qty 1

## 2018-05-20 MED ORDER — ACETAMINOPHEN 325 MG PO TABS
650.0000 mg | ORAL_TABLET | ORAL | Status: DC | PRN
Start: 2018-05-20 — End: 2018-05-22
  Administered 2018-05-20 – 2018-05-21 (×2): 650 mg via ORAL
  Filled 2018-05-20 (×3): qty 2

## 2018-05-20 MED ORDER — SIMETHICONE 80 MG PO CHEW
80.0000 mg | CHEWABLE_TABLET | Freq: Three times a day (TID) | ORAL | Status: DC
  Administered 2018-05-20 – 2018-05-22 (×7): 80 mg via ORAL
  Filled 2018-05-20 (×7): qty 1

## 2018-05-20 MED ORDER — SIMETHICONE 80 MG PO CHEW: 80.0000 mg | CHEWABLE_TABLET | ORAL | Status: DC | PRN

## 2018-05-20 MED ORDER — COCONUT OIL OIL: 1.0000 "application " | TOPICAL_OIL | Status: DC | PRN

## 2018-05-20 MED ORDER — OXYCODONE HCL 5 MG PO TABS
10.0000 mg | ORAL_TABLET | ORAL | Status: DC | PRN
  Administered 2018-05-20 – 2018-05-21 (×5): 10 mg via ORAL
  Filled 2018-05-20 (×6): qty 2

## 2018-05-20 MED ORDER — PRENATAL MULTIVITAMIN CH
1.0000 | ORAL_TABLET | Freq: Every day | ORAL | Status: DC
  Administered 2018-05-20 – 2018-05-22 (×3): 1 via ORAL
  Filled 2018-05-20 (×3): qty 1

## 2018-05-20 MED ORDER — MENTHOL 3 MG MT LOZG: 1.0000 | LOZENGE | OROMUCOSAL | Status: DC | PRN

## 2018-05-20 MED ORDER — SIMETHICONE 80 MG PO CHEW
80.0000 mg | CHEWABLE_TABLET | ORAL | Status: DC
  Administered 2018-05-20 – 2018-05-21 (×3): 80 mg via ORAL
  Filled 2018-05-20 (×3): qty 1

## 2018-05-20 NOTE — Anesthesia Postprocedure Evaluation (Signed)
Anesthesia Post Note  Patient: Kim [name]  Procedure(s) Performed: CESAREAN SECTION (N/A )     Patient location during evaluation: Mother Baby Anesthesia Type: Epidural Level of consciousness: awake Pain management: pain level controlled Vital Signs Assessment: post-procedure vital signs reviewed and stable Respiratory status: spontaneous breathing Cardiovascular status: stable Postop Assessment: epidural receding and patient able to bend at knees Anesthetic complications: no    Last Vitals:  Vitals:   05/20/18 0112 05/20/18 0604  BP: 104/62   Pulse: 62   Resp: 20   Temp: 36.8 C 36.4 C  SpO2: 100% 100%    Last Pain:  Vitals:   05/20/18 0604  TempSrc: Oral  PainSc:    Pain Goal:                 Edison PaceWILKERSON,Tayva Easterday

## 2018-05-20 NOTE — Progress Notes (Signed)
  Patient is eating, ambulating, voiding.  Pain control is good.  Vitals:   05/19/18 2315 05/20/18 0012 05/20/18 0112 05/20/18 0604  BP: 106/84 104/69 104/62   Pulse: 62 66 62   Resp: 20 20 20    Temp: 98.7 F (37.1 C) 98.2 F (36.8 C) 98.2 F (36.8 C) 97.6 F (36.4 C)  TempSrc: Oral Oral Oral Oral  SpO2: 99% 100% 100% 100%  Weight:      Height:        lungs:   clear to auscultation cor:    RRR Abdomen:  soft, appropriate tenderness, incisions intact and without erythema or exudate ex:    no cords   Lab Results  Component Value Date   WBC 15.9 (H) 05/20/2018   HGB 9.9 (L) 05/20/2018   HCT 29.0 (L) 05/20/2018   MCV 87.1 05/20/2018   PLT 130 (L) 05/20/2018    --/--/A POS (06/10 0528)/RI  A/P    Post operative day 1.  Routine post op and postpartum care.  Expect d/c tomorrow.  Percocet for pain control. Iron.

## 2018-05-20 NOTE — Lactation Note (Signed)
This note was copied from a baby's chart. Lactation Consultation Note  Patient Name: Girl Marta LamasBriana Lonzo AVWUJ'WToday's Date: 05/20/2018 Reason for consult: Follow-up assessment;Early term 37-38.6wks;1st time breastfeeding;Primapara   Follow up with mom of 19 hour old infant. Infant with 3 BF fr 10-13 minutes, 5 BF attempts, 1 void and 3 stools in the last 24 hours. Infant weight 7 pounds 10-2 ounces with 1% weight loss since birth. Infant asleep in her crib.   Mom reports infant is feeding better. Mom reports feeding infant with feeding cues. Mom reports infant gets sleepy at the breast and she uses breast massage and infant stimulation to keep infant feeding. Mom denies pain with feeding. Mom reports she breasts are feeling heavier today.   Enc mom to continue feeding infant STS 8-12 x in 24 hours at first feeding cues. Mom denies questions/cocnerns at this time. Mom to call out for feeding assistance as needed.    Maternal Data Formula Feeding for Exclusion: No Has patient been taught Hand Expression?: Yes Does the patient have breastfeeding experience prior to this delivery?: No  Feeding Feeding Type: Breast Fed  LATCH Score                   Interventions Interventions: Breast compression;Hand express;Breast massage;Skin to skin  Lactation Tools Discussed/Used WIC Program: Yes   Consult Status Consult Status: Follow-up Date: 05/21/18 Follow-up type: In-patient    Silas FloodSharon S Hice 05/20/2018, 2:51 PM

## 2018-05-21 LAB — BIRTH TISSUE RECOVERY COLLECTION (PLACENTA DONATION)

## 2018-05-21 NOTE — Lactation Note (Signed)
This note was copied from a baby's chart. Lactation Consultation Note  Patient Name: Kim [name] ZOXWR'UToday's Date: 05/21/2018 Reason for consult: Follow-up assessment;Primapara;1st time breastfeeding;Early term 37-38.6wks;Infant weight loss  2nd visit - to assist with latch - see doc flow sheets    Maternal Data Has patient been taught Hand Expression?: Yes  Feeding Feeding Type: Breast Fed Length of feed: 10 min  LATCH Score Latch: Too sleepy or reluctant, no latch achieved, no sucking elicited.  Audible Swallowing: None  Type of Nipple: Everted at rest and after stimulation  Comfort (Breast/Nipple): Filling, red/small blisters or bruises, mild/mod discomfort  Hold (Positioning): Assistance needed to correctly position infant at breast and maintain latch.  LATCH Score: 4  Interventions Interventions: Breast feeding basics reviewed  Lactation Tools Discussed/Used Tools: Pump Breast pump type: Manual Pump Review: Milk Storage   Consult Status Consult Status: Follow-up Date: 05/22/18 Follow-up type: In-patient    Matilde SprangMargaret Ann Kia Varnadore 05/21/2018, 10:14 AM

## 2018-05-21 NOTE — Lactation Note (Signed)
This note was copied from a baby's chart. Lactation Consultation Note  Patient Name: Kim Marta LamasBriana [name] ZOXWR'UToday's Date: 05/21/2018 Reason for consult: Follow-up assessment;Infant weight loss;Early term 37-38.6wks;Primapara;1st time breastfeeding(5% weight loss )  Baby is 37 hours old  LC reviewed doc flow sheets and updated.  Mom denies soreness with latching, breast are fuller, and change in colostrum, hearing swallows.  Sore nipple and engorgement prevention and tx .  Mom has hand pump and a DEBP at home.    Maternal Data    Feeding Feeding Type: (LC enc to feed after her breakfast ) Length of feed: 20 min  LATCH Score Latch: (nec mom to call for Latch assessment )                 Interventions Interventions: Breast feeding basics reviewed;Hand pump  Lactation Tools Discussed/Used Tools: Pump Breast pump type: Manual Pump Review: Milk Storage   Consult Status Consult Status: Follow-up Date: 05/21/18 Follow-up type: In-patient    Kim [name] 05/21/2018, 9:32 AM

## 2018-05-21 NOTE — Lactation Note (Signed)
This note was copied from a baby's chart. Lactation Consultation Note  Patient Name: Kim Marta LamasBriana [name] Kim [name]: 05/21/2018 Reason for consult: Follow-up assessment;Primapara;1st time breastfeeding;Early term 37-38.6wks;Infant weight loss;[facility]'s request([facility] )  [facility] LC showed mom how to use her DEBP and had mom feel the suction,  #24 F appropriate size and per mom comfortable.  Will check mom tomorrow prior to D/C.     Maternal Data    Feeding  LATCH Score                   Interventions Interventions: Breast feeding basics reviewed  Lactation Tools Discussed/Used     Consult Status Consult Status: Follow-up [name]: 05/22/18 Follow-up type: In-patient    Kim [name] 05/21/2018, 4:01 PM

## 2018-05-21 NOTE — Progress Notes (Signed)
Patient is eating, ambulating, voiding.  Pain control is good.  Pt reports gas pain.  Appropriate lochia, no other complaints.  Vitals:   05/20/18 1010 05/20/18 1410 05/20/18 1436 05/21/18 0550  BP: (!) 94/57 (!) 90/53 100/74 99/60  Pulse: 60 (!) 54 63 66  Resp: 18 18 18 18   Temp: 97.7 F (36.5 C) 97.8 F (36.6 C) 97.6 F (36.4 C) 97.8 F (36.6 C)  TempSrc: Oral Oral Oral Oral  SpO2: 100%     Weight:      Height:        Fundus firm Inc: c/d/i Ext: no calf tenderness  Lab Results  Component Value Date   WBC 15.9 (H) 05/20/2018   HGB 9.9 (L) 05/20/2018   HCT 29.0 (L) 05/20/2018   MCV 87.1 05/20/2018   PLT 130 (L) 05/20/2018    --/--/A POS (06/10 [id number])  A/P Post op day #2 s/p c/s Simethicone for gas pain Encouraged ambulation Pt would like to stay an additional day  Routine care.    Philip AspenALLAHAN, Toa Mia

## 2018-05-22 MED ORDER — OXYCODONE-ACETAMINOPHEN 5-325 MG PO TABS: 1.0000 | ORAL_TABLET | Freq: Four times a day (QID) | ORAL | 0 refills | Status: AC | PRN

## 2018-05-22 MED ORDER — OXYCODONE-ACETAMINOPHEN 5-325 MG PO TABS
1.0000 | ORAL_TABLET | Freq: Four times a day (QID) | ORAL | Status: DC | PRN
  Administered 2018-05-22: 1 via ORAL
  Filled 2018-05-22: qty 1

## 2018-05-22 NOTE — Lactation Note (Signed)
This note was copied from a baby's chart. Lactation Consultation Note  Patient Name: Kim [name] WUJWJ'XToday's Date: 05/22/2018 Reason for consult: Follow-up assessment;Infant weight loss;Primapara;1st time breastfeeding;Early term 37-38.6wks;Other (Comment)(6% weight loss, Bili 9.7 at 0500 / milk coming in )  Baby is 7861 hours old  LC reviewed and updated the doc flow sheets per mom  Per mom breast are fuller and she is hearing more swallows.  Sore nipple and engorgement prevention and rx reviewed,  Mom has hand pump and DEBP .  Mother informed of post-discharge support and given phone number to the lactation department, including services for phone call assistance; out-patient appointments; and breastfeeding support group. List of other breastfeeding resources in the community given in the handout. Encouraged mother to call for problems or concerns related to breastfeeding.   Maternal Data Has patient been taught Hand Expression?: Yes  Feeding Feeding Type: (per mom last fed at 0600 for 20 mins ) Length of feed: 10 min(per mom )  LATCH Score                   Interventions Interventions: Breast feeding basics reviewed  Lactation Tools Discussed/Used Tools: Pump Breast pump type: Double-Electric Breast Pump;Manual WIC Program: Yes Pump Review: Milk Storage(LC reviewed )   Consult Status Consult Status: Complete Date: 05/22/18    Kim [name] 05/22/2018, 9:18 AM

## 2018-05-22 NOTE — Progress Notes (Signed)
  Patient is eating, ambulating, voiding.  Pain control is good.  Vitals:   05/21/18 0550 05/21/18 1421 05/21/18 2222 05/22/18 0619  BP: 99/60 113/65 109/67 117/62  Pulse: 66 (!) 55 73 81  Resp: 18 18  16   Temp: 97.8 F (36.6 C) 98.7 F (37.1 C) 99.2 F (37.3 C) 98.1 F (36.7 C)  TempSrc: Oral Oral Oral Oral  SpO2:      Weight:      Height:        lungs:   clear to auscultation cor:    RRR Abdomen:  soft, appropriate tenderness, incisions intact and without erythema or exudate ex:    no cords   Lab Results  Component Value Date   WBC 15.9 (H) 05/20/2018   HGB 9.9 (L) 05/20/2018   HCT 29.0 (L) 05/20/2018   MCV 87.1 05/20/2018   PLT 130 (L) 05/20/2018    --/--/A POS (06/10 0528)/RI  A/P    Post operative day 3.  Routine post op and postpartum care.  Expect d/c today.  Percocet for pain control.

## 2018-05-22 NOTE — Discharge Summary (Signed)
Obstetric Discharge Summary Reason for Admission: rupture of membranes Prenatal Procedures: none Intrapartum Procedures: cesarean: low cervical, transverse Postpartum Procedures: none Complications-Operative and Postpartum: none Hemoglobin  Date Value Ref Range Status  05/20/2018 9.9 (L) 12.0 - 15.0 g/dL Final   HCT  Date Value Ref Range Status  05/20/2018 29.0 (L) 36.0 - 46.0 % Final    Discharge Diagnoses: Term Pregnancy-delivered  Discharge Information: Date: 05/22/2018 Activity: pelvic rest Diet: routine Medications: Iron and Percocet Condition: stable Instructions: refer to practice specific booklet Discharge to: home Follow-up Information    Marlow Baarslark, Dyanna, MD Follow up in 4 week(s).   Specialty:  Obstetrics Contact information: [address] [place] [id number] [phone]           Newborn Data: Live born female  Birth Weight: 7 lb 11.3 oz (3495 g) APGAR: 10, 10  Newborn Delivery   Birth date/time:  05/19/2018 19:42:00 Delivery type:  C-Section, Low Transverse Trial of labor:  Yes C-section categorization:  Primary     Home with mother.  Myana Schlup A 05/22/2018, 7:39 AM

## 2019-09-10 ENCOUNTER — Other Ambulatory Visit: Payer: Self-pay | Admitting: Emergency Medicine

## 2019-09-10 DIAGNOSIS — Z20822 Contact with and (suspected) exposure to covid-19: Secondary | ICD-10-CM

## 2019-09-11 LAB — NOVEL CORONAVIRUS, NAA: SARS-CoV-2, NAA: NOT DETECTED

## 2019-11-16 ENCOUNTER — Other Ambulatory Visit: Payer: Self-pay

## 2019-11-16 DIAGNOSIS — Z20822 Contact with and (suspected) exposure to covid-19: Secondary | ICD-10-CM

## 2019-11-17 LAB — NOVEL CORONAVIRUS, NAA: SARS-CoV-2, NAA: NOT DETECTED
